# Patient Record
Sex: Male | Born: 1960 | Race: Black or African American | Hispanic: No | Marital: Married | Smoking: Current every day smoker
Health system: Southern US, Community
[De-identification: ages and names within clinical notes are randomized; demographics above are authoritative.]

## PROBLEM LIST (undated history)

## (undated) DIAGNOSIS — I10 Essential (primary) hypertension: Secondary | ICD-10-CM

## (undated) HISTORY — DX: Essential (primary) hypertension: I10

---

## 2010-01-08 HISTORY — PX: LUMBAR SPINE SURGERY: SHX701

## 2013-10-21 ENCOUNTER — Ambulatory Visit (INDEPENDENT_AMBULATORY_CARE_PROVIDER_SITE_OTHER): Payer: Worker's Compensation | Admitting: Internal Medicine

## 2013-10-21 ENCOUNTER — Encounter: Payer: Self-pay | Admitting: Internal Medicine

## 2013-10-21 ENCOUNTER — Institutional Professional Consult (permissible substitution): Payer: Self-pay | Admitting: Pulmonary Disease

## 2013-10-21 ENCOUNTER — Institutional Professional Consult (permissible substitution): Payer: Self-pay | Admitting: Internal Medicine

## 2013-10-21 ENCOUNTER — Encounter (INDEPENDENT_AMBULATORY_CARE_PROVIDER_SITE_OTHER): Payer: Self-pay

## 2013-10-21 ENCOUNTER — Ambulatory Visit (INDEPENDENT_AMBULATORY_CARE_PROVIDER_SITE_OTHER)
Admission: RE | Admit: 2013-10-21 | Discharge: 2013-10-21 | Disposition: A | Discharge status: Home / Self Care | Payer: Worker's Compensation | Source: Ambulatory Visit | Attending: Internal Medicine | Admitting: Internal Medicine

## 2013-10-21 VITALS — BP 132/90 | HR 100 | Temp 98.6°F | Ht 71.0 in | Wt 145.0 lb

## 2013-10-21 DIAGNOSIS — I10 Essential (primary) hypertension: Secondary | ICD-10-CM

## 2013-10-21 DIAGNOSIS — S270XXD Traumatic pneumothorax, subsequent encounter: Secondary | ICD-10-CM

## 2013-10-21 DIAGNOSIS — Z720 Tobacco use: Secondary | ICD-10-CM

## 2013-10-21 DIAGNOSIS — S270XXA Traumatic pneumothorax, initial encounter: Secondary | ICD-10-CM | POA: Insufficient documentation

## 2013-10-21 DIAGNOSIS — F17210 Nicotine dependence, cigarettes, uncomplicated: Secondary | ICD-10-CM

## 2013-10-21 MED ORDER — CLONIDINE HCL 0.1 MG PO TABS: 0.1000 mg | ORAL_TABLET | Freq: Three times a day (TID) | ORAL | Status: DC

## 2013-10-21 MED ORDER — HYDROCODONE-ACETAMINOPHEN 10-500 MG PO TABS: 1.0000 | ORAL_TABLET | ORAL | Status: DC | PRN

## 2013-10-21 NOTE — Patient Instructions (Addendum)
Stop advair, stop the cough medication and the tramadol   Clonidine 0.1 mg three times (called in) daily   The key is to stop smoking completely before smoking completely stops you!   Vicodin 10 mg  one up to  every 4 hours if needed  For cough add delsym to it   Please remember to go to the  x-ray department downstairs for your tests - we will call you with the results when they are available.    Please schedule a follow up office visit in 2 weeks, sooner if needed

## 2013-10-21 NOTE — Progress Notes (Signed)
Subjective:    Patient ID: Brian Webb, male    DOB: 1960-01-30  MRN: [id number]  HPI  [age] yobm active smoker but no prev resp problem struck in R ant chest by 30 lb metal pedal attached to crain 10/13/13 > [place] [facility] [place] R ptx/ rib fx rx with chest tube x 3 days d/c 10/17/13 rx with vicodin 7.5 plus advair and IS and referred to pulmonary clinic by workers comp.  10/21/2013 1st [facility] office visit/ Wert   Chief Complaint  Patient presents with  . Pulmonary Consult    Workman's Comp case. Pt states was hit in the face and chest by part of a crane at work on 10-13-13. He states that he suffered from rt lung collapse and four rib fractures.  He c/o CP, back pain and SOB since this happened.   taking up to x .7.5 mg vicodin for pain plus added norvasc for hbp. Still scant old blood being coughed up. Sob x more than room to room walking no better on advair ? Aggravates coughing  No obvious other patterns in day to day or daytime variabilty or assoc   chest tightness, subjective wheeze overt sinus or hb symptoms. No unusual exp hx or h/o childhood pna/ asthma or knowledge of premature birth.  Sleeping ok p vicodin  without nocturnal  or early am exacerbation  of respiratory  c/o's or need for noct saba. Also denies any obvious fluctuation of symptoms with weather or environmental changes or other aggravating or alleviating factors except as outlined above   Current Medications, Allergies, Complete Past Medical History, Past Surgical History, Family History, and Social History were reviewed in [facility] electronic medical record.            Review of Systems  Constitutional: Positive for appetite change and unexpected weight change. Negative for fever, chills and activity change.  HENT: Positive for congestion. Negative for dental problem, postnasal drip, rhinorrhea, sneezing, sore throat, trouble swallowing and voice change.   Eyes: Negative for visual  disturbance.  Respiratory: Positive for cough and shortness of breath. Negative for choking.   Cardiovascular: Positive for chest pain. Negative for leg swelling.  Gastrointestinal: Positive for abdominal pain. Negative for nausea and vomiting.  Genitourinary: Negative for difficulty urinating.  Musculoskeletal: Positive for arthralgias.  Skin: Negative for rash.  Psychiatric/Behavioral: Negative for behavioral problems and confusion.       Objective:   Physical Exam  amb bm nad   Wt Readings from Last 3 Encounters:  10/21/13 145 lb (65.772 kg)      HEENT: nl dentition, turbinates, and orophanx. Nl external ear canals without cough reflex   NECK :  without JVD/Nodes/TM/ nl carotid upstrokes bilaterally   LUNGS: no CW bruising or obvious rib instability/no acc muscle use, clear to A and P bilaterally without cough on insp or exp maneuvers   CV:  RRR  no s3 or murmur or increase in P2, no edema   ABD:  soft and nontender with nl excursion in the supine position. No bruits or organomegaly, bowel sounds nl  MS:  warm without deformities, calf tenderness, cyanosis or clubbing  SKIN: warm and dry without lesions    NEURO:  alert, approp, no deficits    CXR  10/21/2013 :  1. Two pleural-based opacities in the right mid and upper hemi thorax without definite associated rib fractures. Possible pleural hematomas but primary or metastatic disease cannot be excluded. Recommend followup and possible  CT of the chest with IV contrast if further assessment is warranted. 2. No pneumothorax is seen        Assessment & Plan:

## 2013-10-21 NOTE — Progress Notes (Signed)
Quick Note:  Called both numbers listed and they are both disconnected   ______

## 2013-10-22 ENCOUNTER — Telehealth: Payer: Self-pay | Admitting: Internal Medicine

## 2013-10-22 DIAGNOSIS — F17210 Nicotine dependence, cigarettes, uncomplicated: Secondary | ICD-10-CM | POA: Insufficient documentation

## 2013-10-22 DIAGNOSIS — I10 Essential (primary) hypertension: Secondary | ICD-10-CM | POA: Insufficient documentation

## 2013-10-22 MED ORDER — HYDROCODONE-ACETAMINOPHEN 5-300 MG PO TABS: ORAL_TABLET | ORAL | Status: AC

## 2013-10-22 NOTE — Assessment & Plan Note (Signed)
Obviously he had a punctured lung from a rib fx but the ptx has resolved and will now just need adequate analgesia and up to 4 more weeks of rest to heal the fx's/ residual cw pain

## 2013-10-22 NOTE — Telephone Encounter (Signed)
rx has been printed out and will have MW sign this.

## 2013-10-22 NOTE — Telephone Encounter (Signed)
MW please advise if we can change the medication strength of the vicodin that was given to the pt yesterday.  He stated that he took this to the pharmacy yesterday and they told him that this strength is no longer available.   Please advise.    No Known Allergies  Current Outpatient Prescriptions on File Prior to Visit  Medication Sig Dispense Refill  . amLODipine (NORVASC) 10 MG tablet Take 10 mg by mouth daily.      Marland Kitchen. azithromycin (ZITHROMAX) 500 MG tablet Take 500 mg by mouth daily.      . cloNIDine (CATAPRES) 0.1 MG tablet Take 1 tablet (0.1 mg total) by mouth 3 (three) times daily.  90 tablet  11  . HYDROcodone-acetaminophen (LORTAB 10) 10-500 MG per tablet Take 1 tablet by mouth every 4 (four) hours as needed for pain.  90 tablet  0   No current facility-administered medications on file prior to visit.

## 2013-10-22 NOTE — Telephone Encounter (Signed)
Ok then to do vicodin 5-300 x 2 to replace it (so give him twice as many)

## 2013-10-22 NOTE — Assessment & Plan Note (Signed)
No evidence of sign CB or AB so no need for advair which just aggravates the coughing which aggravates the pain  However, strongly rec he quit smoking

## 2013-10-22 NOTE — Assessment & Plan Note (Signed)
He did not report h/o hbp prior to accident so most likely this was related to pain from the chest wall injury and best choice is therefore clonidine try 0.1 mg tid

## 2013-10-23 NOTE — Telephone Encounter (Signed)
rx up front for pick up  Pt aware

## 2013-10-23 NOTE — Telephone Encounter (Signed)
Pt has not heard from nurse. [phone]

## 2013-11-04 ENCOUNTER — Ambulatory Visit (INDEPENDENT_AMBULATORY_CARE_PROVIDER_SITE_OTHER)
Admission: RE | Admit: 2013-11-04 | Discharge: 2013-11-04 | Disposition: A | Discharge status: Home / Self Care | Payer: Worker's Compensation | Source: Ambulatory Visit | Attending: Internal Medicine | Admitting: Internal Medicine

## 2013-11-04 ENCOUNTER — Ambulatory Visit (INDEPENDENT_AMBULATORY_CARE_PROVIDER_SITE_OTHER): Payer: Worker's Compensation | Admitting: Internal Medicine

## 2013-11-04 ENCOUNTER — Encounter: Payer: Self-pay | Admitting: Internal Medicine

## 2013-11-04 VITALS — BP 126/80 | HR 70 | Ht 71.0 in | Wt 153.0 lb

## 2013-11-04 DIAGNOSIS — S270XXD Traumatic pneumothorax, subsequent encounter: Secondary | ICD-10-CM

## 2013-11-04 DIAGNOSIS — Z720 Tobacco use: Secondary | ICD-10-CM

## 2013-11-04 DIAGNOSIS — R059 Cough, unspecified: Secondary | ICD-10-CM

## 2013-11-04 DIAGNOSIS — R05 Cough: Secondary | ICD-10-CM

## 2013-11-04 DIAGNOSIS — I10 Essential (primary) hypertension: Secondary | ICD-10-CM

## 2013-11-04 DIAGNOSIS — F17210 Nicotine dependence, cigarettes, uncomplicated: Secondary | ICD-10-CM

## 2013-11-04 MED ORDER — PREDNISONE 10 MG PO TABS: ORAL_TABLET | ORAL | Status: DC

## 2013-11-04 MED ORDER — AZITHROMYCIN 250 MG PO TABS: ORAL_TABLET | ORAL | Status: DC

## 2013-11-04 NOTE — Patient Instructions (Addendum)
zpak Prednisone 10 mg take  4 each am x 2 days,   2 each am x 2 days,  1 each am x 2 days and stop   Zostrix cream four time along chest wall  For cough > mucinex dm up to 1200  Mg every 12 hours  Please remember to go to the   x-ray department downstairs for your tests - we will call you with the results when they are available.  Continue clonidine three times daily (bfast, supper, bedtime)   Please schedule a follow up office visit in 1 week, sooner if needed with all meds in hand including anything over the counter

## 2013-11-04 NOTE — Progress Notes (Signed)
Subjective:    Patient ID: Brian Webb, male    DOB: 12/12/1960  MRN: [id number]  HPI  [age] yobm acTemple Pacinitive smoker but no prev resp problem struck in R ant chest by 30 lb metal pedal attached to crain 10/13/13 > [facility] dx R ptx/ rib fx rx with chest tube x 3 days d/c 10/17/13 rx with vicodin 7.5 plus advair and IS and referred to pulmonary clinic by workers comp.  10/21/2013 1st [place] [facility] office visit/ Brian Webb   Chief Complaint  Patient presents with  . Pulmonary Consult    Workman's Comp case. Pt states was hit in the face and chest by part of a crane at work on 10-13-13. He states that he suffered from rt lung collapse and four rib fractures.  He c/o CP, back pain and SOB since this happened.   taking up to x 7.5 mg vicodin for pain plus added norvasc for hbp. Still scant old blood being coughed up. Sob x more than room to room walking no better on advair ? Aggravates coughing rec Stop advair, stop the cough medication and the tramadol  Clonidine 0.1 mg three times (called in) daily  The key is to stop smoking completely before smoking completely stops you!  Vicodin 10 mg  one up to  every 4 hours if needed  For cough add delsym to it    11/04/2013 f/u ov/Brian Webb re:  Chest injury  Chief Complaint  Patient presents with  . Follow-up    Pt states that his CP and back pain are not improving. Can not sleep on his left side or sleep on his back due to pain. His breathing is about the same.    coughs about 4  Times  In am then done and no excess or purulent sputum Confused with names/ purposes of meds  No obvious day to day or daytime variabilty or assoc   chest tightness, subjective wheeze overt sinus or hb symptoms. No unusual exp hx or h/o childhood pna/ asthma or knowledge of premature birth.  Also denies any obvious fluctuation of symptoms with weather or environmental changes or other aggravating or alleviating factors except as outlined above   Current  Medications, Allergies, Complete Past Medical History, Past Surgical History, Family History, and Social History were reviewed in [facility] electronic medical record.  ROS  The following are not active complaints unless bolded sore throat, dysphagia, dental problems, itching, sneezing,  nasal congestion or excess/ purulent secretions, ear ache,   fever, chills, sweats, unintended wt loss,   exertional cp, hemoptysis,  orthopnea pnd or leg swelling, presyncope, palpitations, heartburn, abdominal pain, anorexia, nausea, vomiting, diarrhea  or change in bowel or urinary habits, change in stools or urine, dysuria,hematuria,  rash, arthralgias, visual complaints, headache, numbness weakness or ataxia or problems with walking or coordination,  change in mood/affect or memory.                         Objective:   Physical Exam  amb bm nad / vs reviewed  Wt Readings from Last 3 Encounters:  11/04/13 153 lb (69.4 kg)  10/21/13 145 lb (65.772 kg)         HEENT: nl dentition, turbinates, and orophanx. Nl external ear canals without cough reflex   NECK :  without JVD/Nodes/TM/ nl carotid upstrokes bilaterally   LUNGS: no CW bruising or obvious rib instability/no acc muscle use, clear to A and P  bilaterally without cough on insp or exp maneuvers   CV:  RRR  no s3 or murmur or increase in P2, no edema   ABD:  soft and nontender with nl excursion in the supine position. No bruits or organomegaly, bowel sounds nl  MS:  warm without deformities, calf tenderness, cyanosis or clubbing  SKIN: warm and dry without lesions    NEURO:  alert, approp, no deficits    CXR  11/04/13 Decrease in size of pleural opacities noted previously in the right hemi thorax peripherally most consistent with resolving hematomas with adjacent rib fractures now noted.Decrease in size of pleural opacities noted previously in the right hemi thorax peripherally most consistent with resolving hematomas with  adjacent rib fractures now noted.        Assessment & Plan:

## 2013-11-04 NOTE — Progress Notes (Signed)
Quick Note:  LMTCB ______ 

## 2013-11-08 DIAGNOSIS — R059 Cough, unspecified: Secondary | ICD-10-CM | POA: Insufficient documentation

## 2013-11-08 DIAGNOSIS — R05 Cough: Secondary | ICD-10-CM | POA: Insufficient documentation

## 2013-11-08 NOTE — Assessment & Plan Note (Signed)
Adequate control on present rx, reviewed > no change in rx needed  He just needs to take his clonidine correctly and it will also help with pain control

## 2013-11-08 NOTE — Assessment & Plan Note (Signed)
Again strongly urged he stop all smoking noting the am cough is typical of evolving CB but no evidence yet of any ab/copd (ie not too late to quit)

## 2013-11-08 NOTE — Assessment & Plan Note (Signed)
Even am coughing may impair chest wall healing ? Does he have component of CB  rx zpak/ pred x 6 days and regroup in 1 week with all active meds in hand

## 2013-11-08 NOTE — Assessment & Plan Note (Signed)
Work related trauma 10/13/13 required chest tube   No evidence of recurrence, main issue is pain control  See instructions for specific recommendations which were reviewed directly with the patient who was given a copy with highlighter outlining the key components.

## 2013-11-11 ENCOUNTER — Encounter: Payer: Self-pay | Admitting: Internal Medicine

## 2013-11-11 ENCOUNTER — Ambulatory Visit (INDEPENDENT_AMBULATORY_CARE_PROVIDER_SITE_OTHER): Payer: Worker's Compensation | Admitting: Internal Medicine

## 2013-11-11 VITALS — BP 132/80 | HR 90 | Ht 71.0 in | Wt 150.0 lb

## 2013-11-11 DIAGNOSIS — S270XXD Traumatic pneumothorax, subsequent encounter: Secondary | ICD-10-CM

## 2013-11-11 DIAGNOSIS — I10 Essential (primary) hypertension: Secondary | ICD-10-CM

## 2013-11-11 NOTE — Progress Notes (Signed)
Subjective:    Patient ID: Temple PaciniFernando Ilagan, male    DOB: 01/05/1961  MRN: [id number]  Brief patient profile:  52 yobm active smoker but no prev resp problem struck in R ant chest by 30 lb metal pedal attached to crain 10/13/13 > [facility] dx R ptx/ rib fx rx with chest tube x 3 days d/c 10/17/13 rx with vicodin 7.5 plus advair and IS and referred to pulmonary clinic by workers comp.   History of Present Illness  10/21/2013 1st [facility] office visit/ Miking Usrey   Chief Complaint  Patient presents with  . Pulmonary Consult    Workman's Comp case. Pt states was hit in the face and chest by part of a crane at work on 10-13-13. He states that he suffered from rt lung collapse and four rib fractures.  He c/o CP, back pain and SOB since this happened.   taking up to x 7.5 mg vicodin for pain plus added norvasc for hbp. Still scant old blood being coughed up. Sob x more than room to room walking no better on advair ? Aggravates coughing rec Stop advair, stop the cough medication and the tramadol  Clonidine 0.1 mg three times (called in) daily  The key is to stop smoking completely before smoking completely stops you!  Vicodin 10 mg  one up to  every 4 hours if needed  For cough add delsym to it    11/04/2013 f/u ov/Ed Rayson re:  Chest injury  Chief Complaint  Patient presents with  . Follow-up    Pt states that his CP and back pain are not improving. Can not sleep on his left side or sleep on his back due to pain. His breathing is about the same.    coughs about 4  Times  In am then done and no excess or purulent sputum Confused with names/ purposes of meds rec zpak Prednisone 10 mg take  4 each am x 2 days,   2 each am x 2 days,  1 each am x 2 days and stop  Zostrix cream four time along chest wall For cough > mucinex dm up to 1200  Mg every 12 hours Continue clonidine three times daily (bfast, supper, bedtime)  Please schedule a follow up office visit in 1 week,  sooner if needed with all meds in hand including anything over the counter   11/11/2013 f/u ov/Adi Seales re: cp p trauma/ did not bring all meds/ still confused with clonidine scheduled/ prn narcs/ still smoking  Chief Complaint  Patient presents with  . Follow-up    Pt reports that his pain is no better. Breathing has improved. No new co's today.  never took pred/ "cough not that bad"  No obvious day to day or daytime variabilty or assoc sob    chest tightness, subjective wheeze overt sinus or hb symptoms. No unusual exp hx or h/o childhood pna/ asthma or knowledge of premature birth.  Also denies any obvious fluctuation of symptoms with weather or environmental changes or other aggravating or alleviating factors except as outlined above   Current Medications, Allergies, Complete Past Medical History, Past Surgical History, Family History, and Social History were reviewed in [facility] electronic medical record.  ROS  The following are not active complaints unless bolded sore throat, dysphagia, dental problems, itching, sneezing,  nasal congestion or excess/ purulent secretions, ear ache,   fever, chills, sweats, unintended wt loss,   exertional cp, hemoptysis,  orthopnea pnd or  leg swelling, presyncope, palpitations, heartburn, abdominal pain, anorexia, nausea, vomiting, diarrhea  or change in bowel or urinary habits, change in stools or urine, dysuria,hematuria,  rash, arthralgias, visual complaints, headache, numbness weakness or ataxia or problems with walking or coordination,  change in mood/affect or memory.                         Objective:   Physical Exam  amb bm nad / vs reviewed   Wt Readings from Last 3 Encounters:  11/11/13 150 lb (68.04 kg)  11/04/13 153 lb (69.4 kg)  10/21/13 145 lb (65.772 kg)    Vital signs reviewed         HEENT: nl dentition, turbinates, and orophanx. Nl external ear canals without cough reflex   NECK :  without JVD/Nodes/TM/ nl  carotid upstrokes bilaterally   LUNGS: no CW bruising or obvious rib instability/no acc muscle use, clear to A and P bilaterally without cough on insp or exp maneuvers   CV:  RRR  no s3 or murmur or increase in P2, no edema   ABD:  soft and nontender with nl excursion in the supine position. No bruits or organomegaly, bowel sounds nl  MS:  warm without deformities, calf tenderness, cyanosis or clubbing  SKIN: warm and dry without lesions    NEURO:  alert, approp, no deficits    CXR  11/04/13 Decrease in size of pleural opacities noted previously in the right hemi thorax peripherally most consistent with resolving hematomas with adjacent rib fractures now noted.Decrease in size of pleural opacities noted previously in the right hemi thorax peripherally most consistent with resolving hematomas with adjacent rib fractures now noted.        Assessment & Plan:

## 2013-11-11 NOTE — Patient Instructions (Addendum)
Start zostrix cream now (over the counter ) 4 x daily just where it hurts  No need for zmak unless cough/ congestion Ok to take prednisone   The key is to stop smoking completely before smoking completely stops you!    Please schedule a follow up office visit in 2 weeks, sooner if needed with cxr on return

## 2013-11-11 NOTE — Assessment & Plan Note (Addendum)
Work related trauma 10/13/13 required chest tube > completely resolved radiographically   Still not understanding how to use meds, has not purchased zostrix as rec  I had an extended discussion with the patient today lasting 15 to 20 minutes of a 25 minute visit on the following issues: No point in continuing to follow him here if he is unwilling to following  My recommendations  Will ask worker's comp to consider second opinion rather than refill any narcs through this office  From my perspective there is no reason he cannot return to work.

## 2013-11-11 NOTE — Assessment & Plan Note (Signed)
Changed clonidine 10/21/13 since related to pain   Adequate control on present rx, reviewed > no change in rx needed

## 2013-11-25 ENCOUNTER — Encounter: Payer: Self-pay | Admitting: Internal Medicine

## 2013-11-25 ENCOUNTER — Ambulatory Visit (INDEPENDENT_AMBULATORY_CARE_PROVIDER_SITE_OTHER): Payer: Worker's Compensation | Admitting: Internal Medicine

## 2013-11-25 ENCOUNTER — Ambulatory Visit (INDEPENDENT_AMBULATORY_CARE_PROVIDER_SITE_OTHER)
Admission: RE | Admit: 2013-11-25 | Discharge: 2013-11-25 | Disposition: A | Discharge status: Home / Self Care | Payer: Worker's Compensation | Source: Ambulatory Visit | Attending: Internal Medicine | Admitting: Internal Medicine

## 2013-11-25 VITALS — BP 132/80 | HR 78 | Ht 71.0 in | Wt 147.0 lb

## 2013-11-25 DIAGNOSIS — I10 Essential (primary) hypertension: Secondary | ICD-10-CM

## 2013-11-25 DIAGNOSIS — Z720 Tobacco use: Secondary | ICD-10-CM

## 2013-11-25 DIAGNOSIS — S270XXD Traumatic pneumothorax, subsequent encounter: Secondary | ICD-10-CM

## 2013-11-25 DIAGNOSIS — F17210 Nicotine dependence, cigarettes, uncomplicated: Secondary | ICD-10-CM

## 2013-11-25 NOTE — Assessment & Plan Note (Signed)
Changed clonidine 10/21/13 since related to pain   rec tid rx which will reduce urge to smoke/pain / anxiety and f/u primary care in [place], here prn

## 2013-11-25 NOTE — Assessment & Plan Note (Signed)
Work related trauma 10/13/13 required chest tube   No residual objective evidence of dz  ? Developing neuralgia type cw pain and should not be continued on narcs from my perspective   Best option = take clonidine/ zostrix as presribed  If not better return with meds in hand to regroup ? With CT chest next if first confirm he's following instructions

## 2013-11-25 NOTE — Assessment & Plan Note (Signed)
>   3 min discussion  I emphasized that although we never turn away smokers from the pulmonary clinic, we do ask that they understand that the recommendations that we make  won't work nearly as well in the presence of continued cigarette exposure.  In fact, we may very well  reach a point where we can't promise to help the patient if he/she can't quit smoking. (We can and will promise to try to help, we just can't promise what we recommend will really work)   Doe have mild acute bronchitis at present but not severe enough to warrant any recs except to d/c cigs

## 2013-11-25 NOTE — Patient Instructions (Signed)
Clonidine should be 0.1mg  = 1 tablet three times daily bfast/ supper/ bedtime  Restart zostric cream 4x daily applied to area of pain and taper this to point where you just take it at bedtime  You need [facility] doctor [facility] is a good one)    If you are satisfied with your treatment plan,  let your doctor know and he/she can either refill your medications or you can return here when your prescription runs out.     If in any way you are not 100% satisfied,  please tell us.  If 100% better, tell your friends!  Pulmonary follow up is as needed

## 2013-11-25 NOTE — Progress Notes (Signed)
Subjective:    Patient ID: Brian Webb, male    DOB: 1960/06/18  MRN: [id number]  Brief patient profile:  52 yobm active smoker but no prev resp problem struck in R ant chest by 30 lb metal pedal attached to crain 10/13/13 > [facility]/ rib fx rx with chest tube x 3 days d/c 10/17/13 rx with vicodin 7.5 plus advair and IS and referred to pulmonary clinic by workers comp.   History of Present Illness  10/21/2013 1st [place] [facility] office visit/ Brian Webb   Chief Complaint  Patient presents with  . Pulmonary Consult    Workman's Comp case. Pt states was hit in the face and chest by part of a crane at work on 10-13-13. He states that he suffered from rt lung collapse and four rib fractures.  He c/o CP, back pain and SOB since this happened.   taking up to x 7.5 mg vicodin for pain plus added norvasc for hbp. Still scant old blood being coughed up. Sob x more than room to room walking no better on advair ? Aggravates coughing rec Stop advair, stop the cough medication and the tramadol  Clonidine 0.1 mg three times (called in) daily  The key is to stop smoking completely before smoking completely stops you!  Vicodin 10 mg  one up to  every 4 hours if needed  For cough add delsym to it    11/04/2013 f/u ov/Brian Webb re:  Chest injury  Chief Complaint  Patient presents with  . Follow-up    Pt states that his CP and back pain are not improving. Can not sleep on his left side or sleep on his back due to pain. His breathing is about the same.    coughs about 4  Times  In am then done and no excess or purulent sputum Confused with names/ purposes of meds rec zpak Prednisone 10 mg take  4 each am x 2 days,   2 each am x 2 days,  1 each am x 2 days and stop  Zostrix cream four time along chest wall For cough > mucinex dm up to 1200  Mg every 12 hours Continue clonidine three times daily (bfast, supper, bedtime)  Please schedule a follow up office visit in 1 week,  sooner if needed with all meds in hand including anything over the counter   11/11/2013 f/u ov/Brian Webb re: cp p trauma/ did not bring all meds/ still confused with clonidine scheduled/ prn narcs/ still smoking  Chief Complaint  Patient presents with  . Follow-up    Pt reports that his pain is no better. Breathing has improved. No new co's today.  never took pred/ "cough not that bad" rec Start zostrix cream now (over the counter ) 4 x daily just where it hurts No need for zmak unless cough/ congestion Ok to take prednisone  The key is to stop smoking completely before smoking completely stops you!     11/25/2013 f/u ov/Brian Webb re: still smoking / persistent cw pain p rib fx 10/13/13 Chief Complaint  Patient presents with  . Follow-up    Pt states that his pain is unchanged. He c/o cough with yellow sputum x 4 days. Breathing is doing well.   says still taking clonidine but just once in am Taking 2 percocet at a time to control the pain Not clear he took the zosrix "used it all up" Not limited by breathing from desired activities  No obvious day to day or daytime variabilty or assoc  chest tightness, subjective wheeze overt sinus or hb symptoms. No unusual exp hx or h/o childhood pna/ asthma or knowledge of premature birth.  Also denies any obvious fluctuation of symptoms with weather or environmental changes or other aggravating or alleviating factors except as outlined above   Current Medications, Allergies, Complete Past Medical History, Past Surgical History, Family History, and Social History were reviewed in [facility] electronic medical record.  ROS  The following are not active complaints unless bolded sore throat, dysphagia, dental problems, itching, sneezing,  nasal congestion or excess/ purulent secretions, ear ache,   fever, chills, sweats, unintended wt loss,   exertional cp, hemoptysis,  orthopnea pnd or leg swelling, presyncope, palpitations, heartburn, abdominal  pain, anorexia, nausea, vomiting, diarrhea  or change in bowel or urinary habits, change in stools or urine, dysuria,hematuria,  rash, arthralgias, visual complaints, headache, numbness weakness or ataxia or problems with walking or coordination,  change in mood/affect or memory.                         Objective:   Physical Exam  amb bm nad / vs reviewed    11/25/2013      147 Wt Readings from Last 3 Encounters:  11/11/13 150 lb (68.04 kg)  11/04/13 153 lb (69.4 kg)  10/21/13 145 lb (65.772 kg)    Vital signs reviewed         HEENT: nl dentition, turbinates, and orophanx. Nl external ear canals without cough reflex   NECK :  without JVD/Nodes/TM/ nl carotid upstrokes bilaterally   LUNGS: no CW bruising or obvious rib instability/no tenderness over R lat/ant chest no acc muscle use, clear to A and P bilaterally without cough on insp or exp maneuvers   CV:  RRR  no s3 or murmur or increase in P2, no edema   ABD:  soft and nontender with nl excursion in the supine position. No bruits or organomegaly, bowel sounds nl  MS:  warm without deformities, calf tenderness, cyanosis or clubbing  SKIN: warm and dry without lesions    NEURO:  alert, approp, no deficits    CXR  11/25/2013 :  No acute cardiopulmonary disease. Resolution of the previously seen right lung opacities. No pneumothorax.        Assessment & Plan:

## 2015-12-20 IMAGING — CR DG CHEST 2V
2 series · 2 of 2 positions shown · non-contrast
Comparison: Chest x-ray of 10/21/2013

CLINICAL DATA: Followup pleural opacities on the right

EXAM:
CHEST  2 VIEW

[view not recorded (1 of 2)]
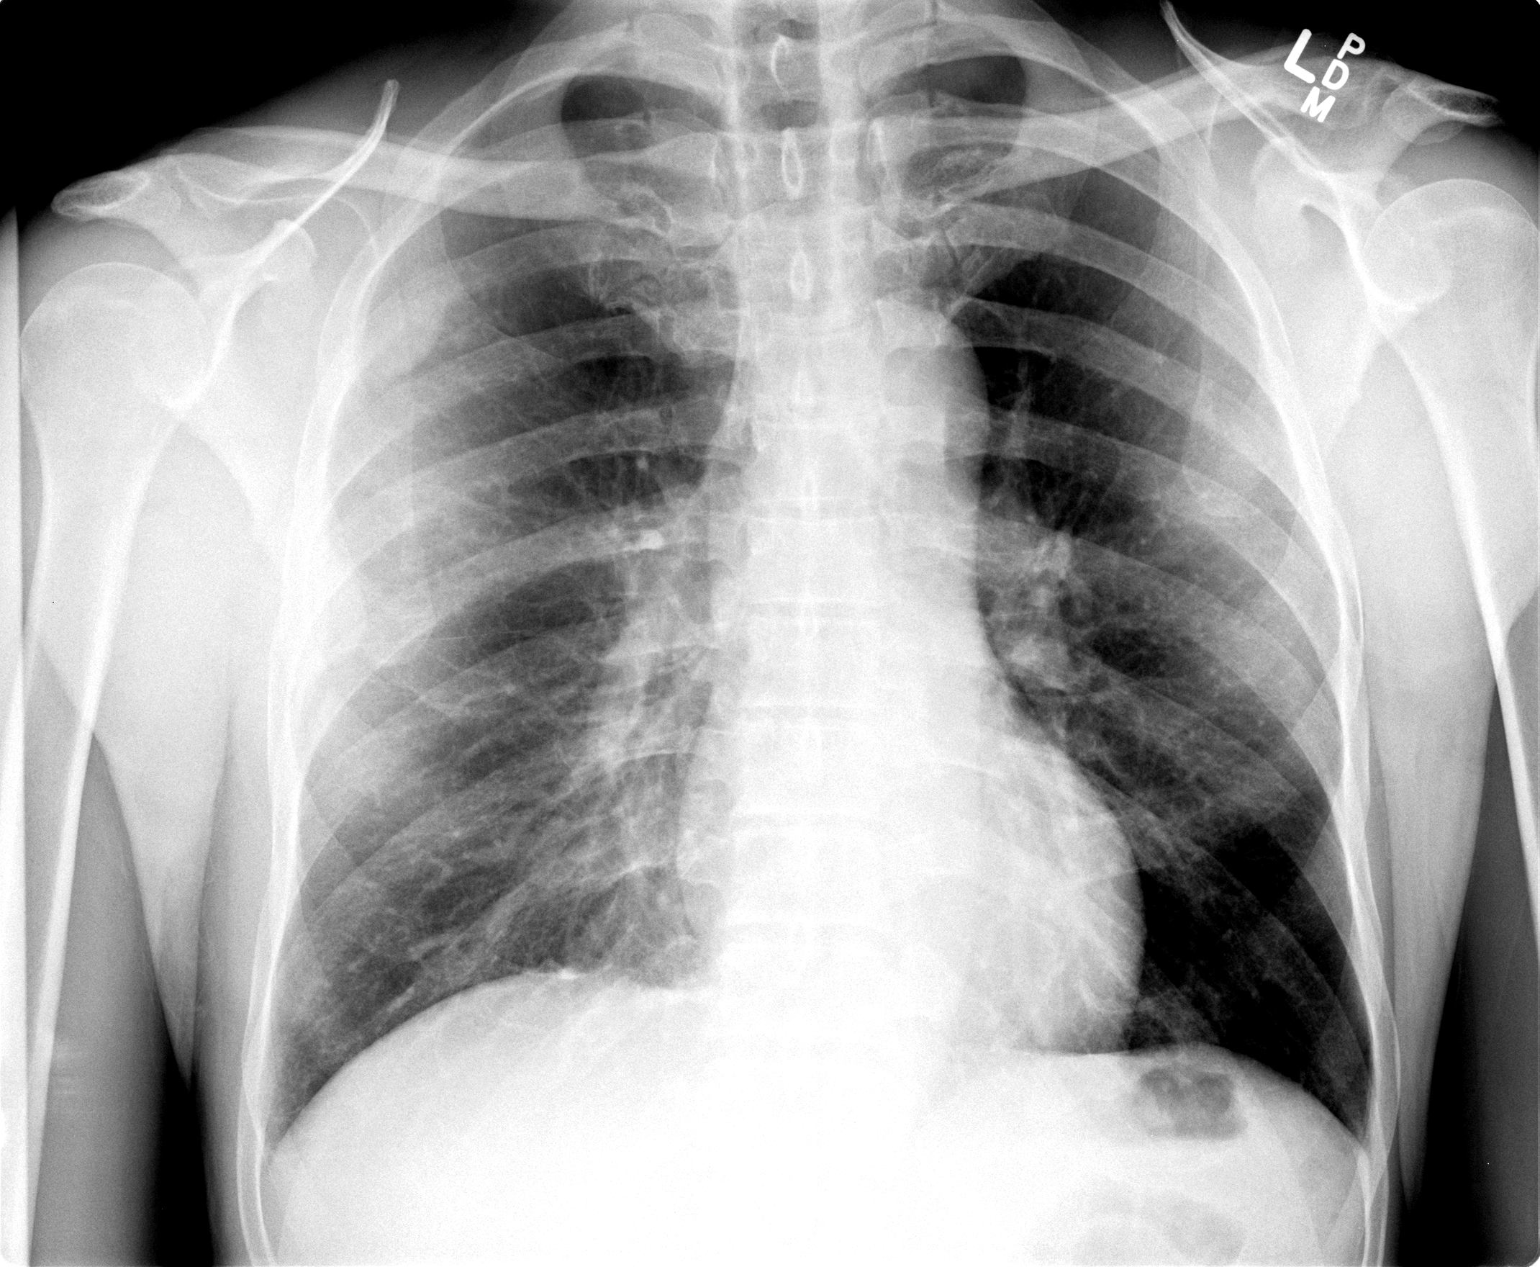

[view not recorded (2 of 2)]
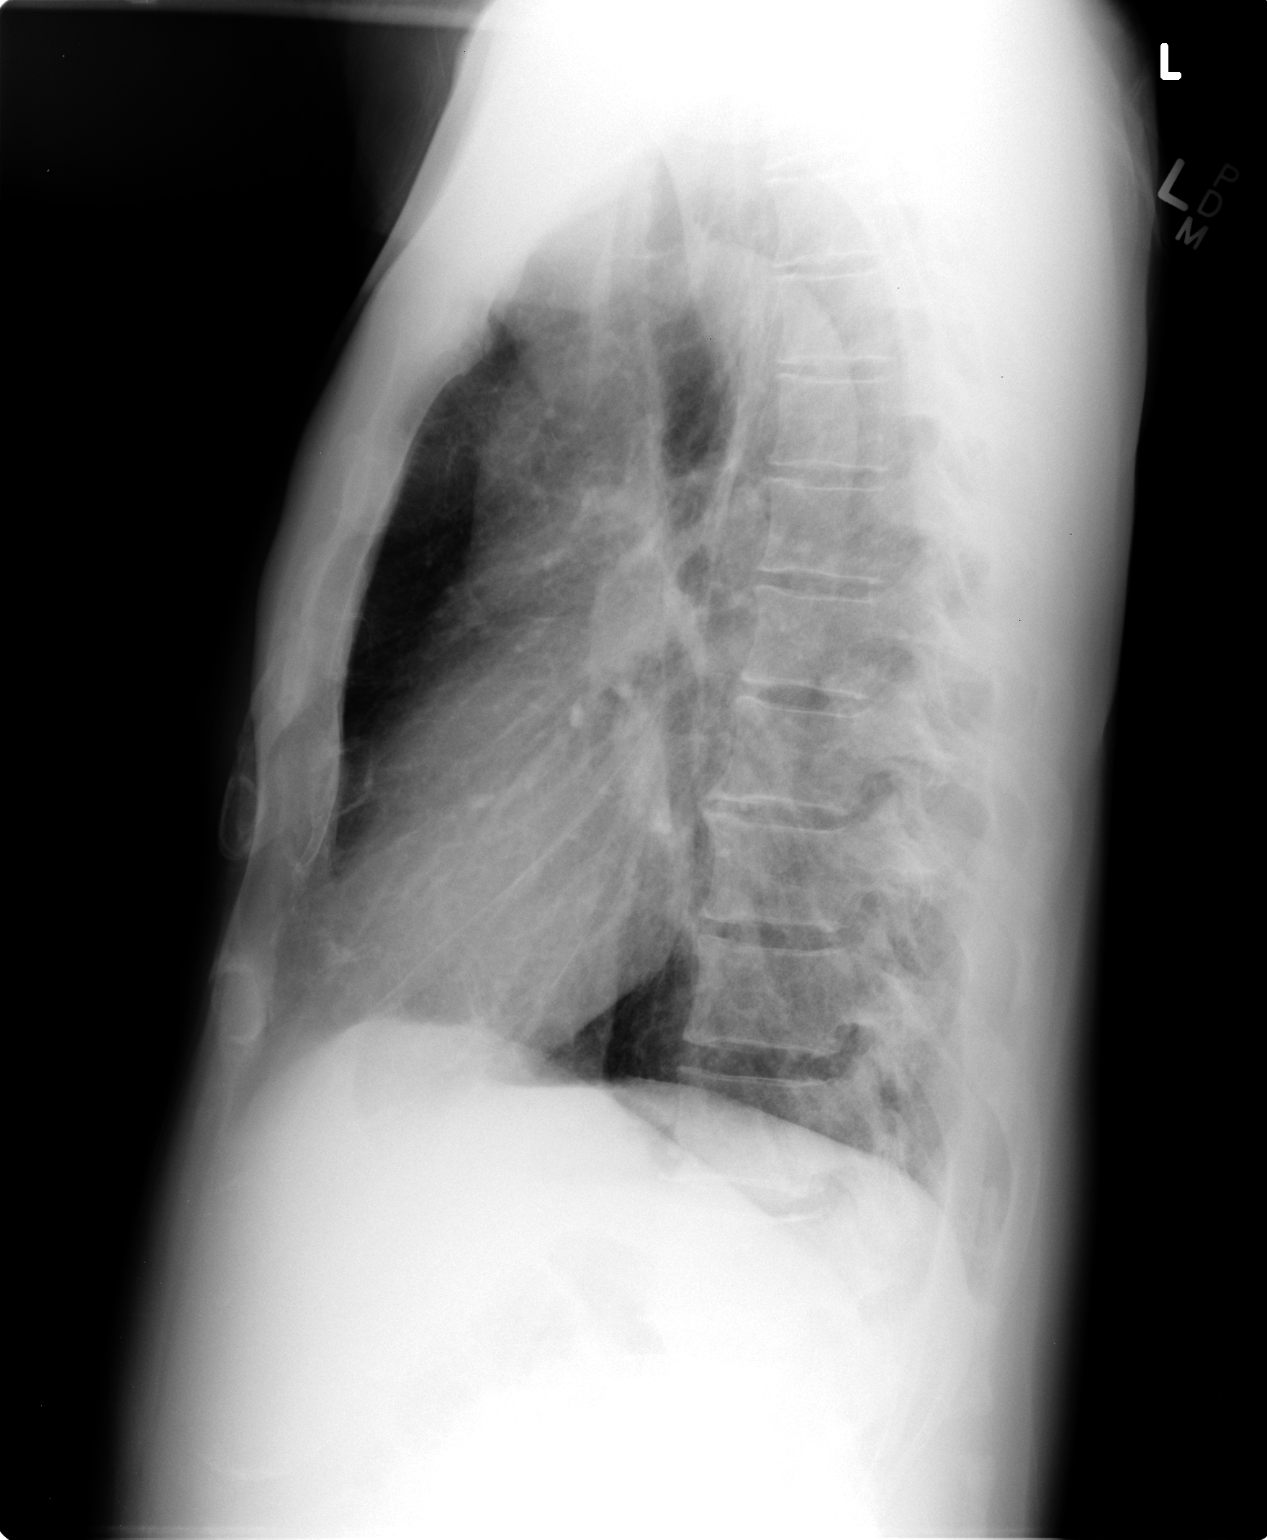

[2 of 2 positions shown; findings below may reference images not displayed]

FINDINGS: The pleural based opacities in the periphery of the right upper hemi
thorax have diminished in size. There does appear to be fracture of
the anterior right second rib and possibly anterior right fifth rib
most likely indicating adjacent pleural hematomas. No pneumothorax
is seen and no pleural effusion is noted. Mediastinal contours are
stable. The heart is within normal limits in size. No bony
abnormality is seen.
IMPRESSION: Decrease in size of pleural opacities noted previously in the right
hemi thorax peripherally most consistent with resolving hematomas
with adjacent rib fractures now noted.
